# Patient Record
Sex: Female | Born: 1975 | Race: White | Hispanic: No | Marital: Single | Smoking: Never smoker
Health system: Southern US, Community
[De-identification: ages and names within clinical notes are randomized; demographics above are authoritative.]

## PROBLEM LIST (undated history)

## (undated) DIAGNOSIS — R7303 Prediabetes: Secondary | ICD-10-CM

## (undated) DIAGNOSIS — U071 COVID-19: Secondary | ICD-10-CM

## (undated) HISTORY — DX: Prediabetes: R73.03

## (undated) HISTORY — PX: TUBAL LIGATION: SHX77

---

## 2005-02-15 ENCOUNTER — Other Ambulatory Visit: Admission: RE | Admit: 2005-02-15 | Discharge: 2005-02-15 | Payer: Self-pay | Admitting: Obstetrics and Gynecology

## 2005-02-16 ENCOUNTER — Ambulatory Visit (HOSPITAL_COMMUNITY): Admission: RE | Admit: 2005-02-16 | Discharge: 2005-02-16 | Payer: Self-pay | Admitting: Obstetrics and Gynecology

## 2005-04-01 ENCOUNTER — Inpatient Hospital Stay (HOSPITAL_COMMUNITY): Admission: AD | Admit: 2005-04-01 | Discharge: 2005-04-01 | Payer: Self-pay | Admitting: *Deleted

## 2005-06-08 ENCOUNTER — Inpatient Hospital Stay (HOSPITAL_COMMUNITY): Admission: AD | Admit: 2005-06-08 | Discharge: 2005-06-08 | Payer: Self-pay | Admitting: Obstetrics and Gynecology

## 2005-06-17 ENCOUNTER — Encounter (INDEPENDENT_AMBULATORY_CARE_PROVIDER_SITE_OTHER): Payer: Self-pay | Admitting: Specialist

## 2005-06-17 ENCOUNTER — Inpatient Hospital Stay (HOSPITAL_COMMUNITY): Admission: RE | Admit: 2005-06-17 | Discharge: 2005-06-20 | Payer: Self-pay | Admitting: Obstetrics and Gynecology

## 2007-08-11 ENCOUNTER — Emergency Department (HOSPITAL_COMMUNITY): Admission: EM | Admit: 2007-08-11 | Discharge: 2007-08-11 | Payer: Self-pay | Admitting: Family Medicine

## 2007-09-07 ENCOUNTER — Ambulatory Visit (HOSPITAL_COMMUNITY): Admission: RE | Admit: 2007-09-07 | Discharge: 2007-09-07 | Payer: Self-pay | Admitting: Obstetrics & Gynecology

## 2007-12-27 ENCOUNTER — Ambulatory Visit: Payer: Self-pay | Admitting: Obstetrics & Gynecology

## 2007-12-27 ENCOUNTER — Inpatient Hospital Stay (HOSPITAL_COMMUNITY): Admission: AD | Admit: 2007-12-27 | Discharge: 2007-12-30 | Payer: Self-pay | Admitting: Obstetrics & Gynecology

## 2008-01-09 ENCOUNTER — Inpatient Hospital Stay (HOSPITAL_COMMUNITY): Admission: AD | Admit: 2008-01-09 | Discharge: 2008-01-09 | Payer: Self-pay | Admitting: Obstetrics and Gynecology

## 2008-11-12 ENCOUNTER — Emergency Department (HOSPITAL_COMMUNITY): Admission: EM | Admit: 2008-11-12 | Discharge: 2008-11-12 | Payer: Self-pay | Admitting: Family Medicine

## 2008-12-03 ENCOUNTER — Emergency Department (HOSPITAL_COMMUNITY): Admission: EM | Admit: 2008-12-03 | Discharge: 2008-12-03 | Payer: Self-pay | Admitting: Family Medicine

## 2010-07-22 ENCOUNTER — Other Ambulatory Visit: Admission: RE | Admit: 2010-07-22 | Discharge: 2010-07-22 | Payer: Self-pay | Admitting: Family Medicine

## 2010-11-07 ENCOUNTER — Encounter: Payer: Self-pay | Admitting: Obstetrics and Gynecology

## 2010-11-30 ENCOUNTER — Other Ambulatory Visit: Payer: Self-pay | Admitting: Obstetrics and Gynecology

## 2011-03-01 NOTE — Op Note (Signed)
NAMEDEMETRESS, [name]             ACCOUNT NO.:  [id number]   MEDICAL RECORD NO.:  [id number]          PATIENT TYPE:  MAT   LOCATION:  MATC                          [facility]   PHYSICIAN:  Phil D. Okey Dupre, M.D.     DATE OF BIRTH:  1976-05-07   DATE OF PROCEDURE:  12/29/2007  DATE OF DISCHARGE:                               OPERATIVE REPORT   PREOPERATIVE DIAGNOSES:  1. Postpartum day number 1 from spontaneous vaginal delivery.  2. Desires sterilization.   POSTOPERATIVE DIAGNOSES:  1. Postpartum day number 1 from spontaneous vaginal delivery.  2. Desires sterilization.   PROCEDURE:  Bilateral tubal occlusion with Filshie clips.   SURGEON:  Argentina Donovan, MD.   ASSISTANT:  Karlton Lemon, MD.   ANESTHESIA:  Epidural.   FINDINGS:  Normal female anatomy.   ESTIMATED BLOOD LOSS:  Minimal.   DRAINS:  Foley with clear yellow urine.   COMPLICATIONS:  None immediate.   SPECIMENS:  None.   INDICATION FOR PROCEDURE:  [name] is a 35 year old gravida 5, para  4-0-1-4, who is postpartum day #1 from spontaneous vaginal delivery.  She desires bilateral tubal ligation, has signed her consent form 30  days in advance.  She has been counseled as to the permanent nature of  the tubal and wishes to proceed.   DESCRIPTION OF THE PROCEDURE:  The patient was taken to the operating  room and after obtaining adequate epidural anesthesia was prepped and  draped in the usual sterile manner in the supine position.  After  ensuring adequate anesthesia, a transverse skin incision was made 1 cm  below the umbilicus using a scalpel.  The incision was carried down to  the subcutaneous tissues using the scalpel.  The subcutaneous tissue and  fascia were dissected further with the Mayo scissors.  The peritoneum  and fascia were entered with Mayo scissors and retractors were placed.  Retractors were used to help visualize the tubes.  Bowel and omentum  obscured the view and a wet laparotomy sponge  was placed within the  peritoneal cavity with a tag to help with visualization.  The patient  was placed in Trendelenburg with a leftward tilt.  The right tube was  identified and clamped with a Babcock clamp.  A second Babcock clamp was  placed and the tube was followed out to the fimbria.  The fimbria was  identified easily.  The Filshie clip was then placed between the Babcock  clamps of the isthmus ampullary junction.  The clip was placed under  direct visualization with good occlusion of the tube.  The Babcock  clamps were then released and the tube was allowed to fall back within  the peritoneal cavity.  Attention was then turned to the left tube.  The  patient was left in Trendelenburg and turned to the right.  The left  tube was well visualized and clamped with a Babcock clamp.  Once again  the tube was followed out to the fimbria using a second Babcock clamp.  A Filshie clip was then placed between the two Babcock clamps at the  isthmus ampullary  junction of the tube, under direct visualization, with  good occlusion of the tube.  After the Filshie clip was placed the tube  was released from the Babcock clamps and the patient placed back within  the peritoneal cavity.  The fascia was then reapproximated with #0  Vicryl in a running and a locked fashion.  A subcuticular stitch was  used to close the skin with #3-0 Vicryl.  Dermabond was used on the skin  as well.  Sponge, needle and instrument counts were correct x2.  The  patient tolerated the procedure well and to the Post Anesthesia Care  Unit in stable condition.      Karlton Lemon, MD  Electronically Signed     ______________________________  Javier Glazier. Okey Dupre, M.D.    NS/MEDQ  D:  12/30/2007  T:  12/30/2007  Job:  [id number]

## 2011-03-04 NOTE — Op Note (Signed)
[name], [name]             ACCOUNT NO.:  [id number]   MEDICAL RECORD NO.:  [id number]          PATIENT TYPE:  MAT   LOCATION:  MATC                          [facility]   PHYSICIAN:  Hal Morales, M.D.DATE OF BIRTH:  12-Oct-1976   DATE OF PROCEDURE:  06/08/2005  DATE OF DISCHARGE:                                 OPERATIVE REPORT   PREOPERATIVE DIAGNOSES:  1.  Intrauterine pregnancy at 36-5/7 weeks.  2.  Homero Fellers breech presentation.  3.  Multipara.  4.  Lower limits of normal amniotic fluid index of 8.  5.  Rh negative status post RhoGAM and 28 weeks.   POSTOPERATIVE DIAGNOSES:  1.  Intrauterine pregnancy at 36-5/7 weeks.  2.  Homero Fellers breech presentation.  3.  Multipara.  4.  Lower limits of normal amniotic fluid index of 8.  5.  Rh negative status post RhoGAM and 28 weeks.   OPERATION:  Attempted external cephalic version.   SURGEON:  Hal Morales, M.D.   FIRST ASSISTANT:  Rhona Leavens, CNM   ANESTHESIA:  None.   ESTIMATED BLOOD LOSS:  None.   COMPLICATIONS:  Inability to execute version.   FINDINGS:  The infant at the time of the preprocedure ultrasound had the  head in the right upper quadrant and the breech in the pelvis. By the time  she had been prepped for version the head was in the left upper quadrant  with the breech in the pelvis. Pelvic examination was 1 cm, cervix was long  and the breech was ballotable. The NST prior to procedure was reactive.   PROCEDURE:  The patient was in the supine position and after noting a  reactive nonstress test and giving terbutaline 0.25 milligrams  subcutaneously, she underwent a second ultrasound for head placement with  the finding of the head in the left upper quadrant. The abdomen was  moistened with ultrasound gel and an attempt made to this allow the baby to  executed a backward roll with the head in the left upper quadrant being  guided toward the pelvis. Several attempts were made to execute this  movement without any significant movement of the fetal head. The external  monitor was then placed on the mother's abdomen and the fetal heart rate in  the 160 range was noted. After a rest period of approximately 3 minutes for  listening to the fetal heart rate, an attempt was made to execute a forward  roll bringing the head from the left upper quadrant to the right upper  quadrant and then toward the pelvis. A similar procedure was used and the  head was moved from the left upper quadrant to the right upper quadrant but  no movement toward the pelvis was possible in spite of several attempts. At  that time the procedure was abandoned and a nonstress test undertaken which  was noted to be reactive. Blood was drawn to determine whether the patient  remained with a positive antibody screen as a result of her RhoGAM at [redacted]  weeks gestation. That result was pending at the time of dictation. However,  if  the antibody screen is positive it will be deemed that she has adequate  protection from any slight number of fetal red cells which may result from  the version, however if the antibody screen negative, she will be given  another dose of RhoGAM.   A discussion was held with the patient concerning further management and a  suggestion made that she plan to undergo cesarean section at [redacted] weeks  gestation if the baby has not moved  to the vertex position. She will follow-  up in the office at [facility]  for women in 1 week.      Hal Morales, M.D.  Electronically Signed     VPH/MEDQ  D:  06/08/2005  T:  06/08/2005  Job:  [id number]

## 2011-03-04 NOTE — Discharge Summary (Signed)
[name], [name]             ACCOUNT NO.:  [id number]   MEDICAL RECORD NO.:  [id number]          PATIENT TYPE:  INP   LOCATION:  9134                          [facility]   PHYSICIAN:  Crist Fat. Rivard, M.D. DATE OF BIRTH:  08-17-1976   DATE OF ADMISSION:  06/17/2005  DATE OF DISCHARGE:  06/20/2005                                 DISCHARGE SUMMARY   ADMISSION DIAGNOSES:  1.  Intrauterine pregnancy at 39 weeks.  2.  Breech presentation.  3.  Failure of external version.   DISCHARGE DIAGNOSES:  1.  Intrauterine pregnancy at 39 weeks.  2.  Breech presentation.  3.  Failure of external version.  4.  Primary low transverse cesarean section of a viable female infant named      Abigail, Apgars 9 and 9, weighing 7 pounds 8 ounces.  5.  Postoperative anemia.   PROCEDURE:  1.  Spinal anesthesia.  2.  Primary low transverse cesarean section.   HOSPITAL COURSE:  The patient was admitted for an elective cesarean section  for breech presentation which failed version.  She was delivered of a female  infant named Abigail, Apgars 9 and 9, weighing 7 pounds 8 ounces.  Estimated  blood loss was 600 cc.  There were no complications.  The procedure was  performed under spinal anesthesia, and the patient was taken to recovery and  then to the Mother/Baby unit where she did well.   On postoperative day #1, hemoglobin was 8.4.  She was started on iron.  JP  was draining a small amount of serosanguineous fluid.  The incision was  clean, dry, and intact and pain was well-controlled with Motrin.  She was  bottle feeding her infant.   On postoperative day #2, the patient continued to improve, and routine care  was continued.   On postoperative day #3, she was ready to go home.   PHYSICAL EXAMINATION:  VITAL SIGNS:  Vital signs were stable.  She was  afebrile.  CHEST:  Clear to auscultation.  HEART:  Regular rate and rhythm.  ABDOMEN:  Soft and appropriately tender.  JP was draining only  2 to 3 cc of  serosanguineous fluid and was removed atraumatically.  The incision with  Steri-Strips was clean, dry, and intact.  Lochia was small.  EXTREMITIES:  Within normal limits.   DISPOSITION:  The patient was deemed to have received the full benefit of  her hospital stay and was discharged home.   DISCHARGE MEDICATIONS:  1.  Motrin 600 mg p.o. q.6h. p.r.n.  2.  Tylox one to two p.o. q.4h. p.r.n.  3.  Iron one p.o. daily.   DISCHARGE LABORATORY DATA:  White blood cell count 9.8, hemoglobin 8.4,  platelets 223.   DISCHARGE INSTRUCTIONS:  The patient received handout.   FOLLOW UP:  Discharge followup in six weeks or p.r.n.      Marie L. Williams, C.N.M.      Crist Fat Rivard, M.D.  Electronically Signed    MLW/MEDQ  D:  06/20/2005  T:  06/20/2005  Job:  [id number]

## 2011-03-04 NOTE — Op Note (Signed)
[name], [name]             ACCOUNT NO.:  [id number]   MEDICAL RECORD NO.:  [id number]          PATIENT TYPE:  INP   LOCATION:  9199                          [facility]   PHYSICIAN:  Crist Fat. Rivard, M.D. DATE OF BIRTH:  [birth year]   DATE OF PROCEDURE:  06/17/2005  DATE OF DISCHARGE:                                 OPERATIVE REPORT   PREOPERATIVE DIAGNOSES:  1.  Intrauterine pregnancy at 39 weeks.  2.  Breech presentation.  3.  Failure of external cephalic version.   POSTOPERATIVE DIAGNOSES:  1.  Intrauterine pregnancy at 39 weeks.  2.  Breech presentation.  3.  Failure of external cephalic version.   ANESTHESIA:  Spinal, Burnett Corrente, M.D.   PROCEDURE:  Primary low transverse cesarean section.   SURGEON:  Crist Fat. Rivard, M.D.   ASSISTANTRenaldo Reel. Latham, C.N.M.   ESTIMATED BLOOD LOSS:  600 mL.   PROCEDURE:  After being informed of the planned procedure with possible  complications including bleeding, infection, injury to bladder, bowels or  ureter, informed consent was obtained.  The patient is taken to cesarean  suite, given spinal anesthesia without complication, placed in a dorsal  decubitus position, pelvis tilted to the left.  The patient is then prepped  and draped in a sterile fashion.  A Foley catheter is inserted in her  bladder.   After assessing adequate level of anesthesia, we infiltrate the suprapubic  area with 20 mL of Marcaine 0.25% and perform a Pfannenstiel incision, which  was brought down to the fascia.  Fascia is incised in a low transverse  fashion.  Linea alba is dissected.  Peritoneum is entered in the midline  fashion.  Visceral peritoneum is entered in a low transverse fashion,  allowing [place] to safely retract bladder by developing a bladder flap.  Myometrium is then entered in a low transverse fashion, first with knife,  then extended bluntly.  Amniotic fluid is clear.  We assist the birth of a  female infant in complete  breech at 11:26.  Mouth and nose are suctioned.  Cord is clamped with two Kelly clamps and sectioned and the baby is given to  the pediatrician present in the room.  Ancef2 g IV is given to the patient,  20 mL of blood is drawn from the umbilical vein, and the placenta is allowed  to deliver spontaneously.  It is complete, and the cord has three vessels.  Uterine revision is negative.   The uterus is then closed in two layers first with a running locked suture  of 0 Vicryl, then with a Lembert suture of 0 Vicryl imbricating the first  layer.  Hemostasis is assessed and completed on the peritoneal edges with  cautery.  Both paracolic gutters are cleaned, both tubes and ovaries  assessed and normal.  The pelvis is then profusely irrigated with warm  saline.  Hemostasis is reassessed and deemed adequate.   Under fascia hemostasis is completed with cautery and the fascia is closed  with two running sutures of 1 Vicryl meeting midline.  Wound is then  irrigated with  warm saline.  Hemostasis is completed with cautery and a JP  #10 drain is placed with a left counter incision and sutured with a 0 silk.  Skin is then closed with a subcuticular suture of 4-0 Monocryl and Steri-  Strips.   Instrument and sponge count is complete x2.  Estimated blood loss was 600  mL.  The procedure is well-tolerated by the patient, is taken to recovery  room in a well and stable condition.   A little girl named Cammy Copa was born at 11:26, received an Apgar of 9 at one  minute and 9 at five minutes and weighs 7 pounds 8 ounces.      Crist Fat Rivard, M.D.  Electronically Signed     SAR/MEDQ  D:  06/17/2005  T:  06/17/2005  Job:  [id number]
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
## 2011-05-25 ENCOUNTER — Other Ambulatory Visit (HOSPITAL_COMMUNITY)
Admission: RE | Admit: 2011-05-25 | Discharge: 2011-05-25 | Disposition: A | Discharge status: Home / Self Care | Payer: BC Managed Care – PPO | Source: Ambulatory Visit | Attending: Family Medicine | Admitting: Family Medicine

## 2011-05-25 ENCOUNTER — Other Ambulatory Visit: Payer: Self-pay | Admitting: Family Medicine

## 2011-05-25 DIAGNOSIS — Z124 Encounter for screening for malignant neoplasm of cervix: Secondary | ICD-10-CM | POA: Insufficient documentation

## 2011-05-25 DIAGNOSIS — Z1159 Encounter for screening for other viral diseases: Secondary | ICD-10-CM | POA: Insufficient documentation

## 2011-07-11 LAB — RPR: RPR Ser Ql: NONREACTIVE

## 2011-07-11 LAB — RH IMMUNE GLOB WKUP(>/=20WKS)(NOT WOMEN'S HOSP): Fetal Screen: NEGATIVE

## 2011-07-11 LAB — CBC
Hemoglobin: 11.6 — ABNORMAL LOW
MCV: 73.5 — ABNORMAL LOW
Platelets: 215
RBC: 4.73
RDW: 18.5 — ABNORMAL HIGH
WBC: 12.8 — ABNORMAL HIGH
WBC: 18.3 — ABNORMAL HIGH

## 2012-08-18 ENCOUNTER — Encounter (HOSPITAL_COMMUNITY): Payer: Self-pay | Admitting: *Deleted

## 2012-08-18 ENCOUNTER — Emergency Department (HOSPITAL_COMMUNITY)
Admission: EM | Admit: 2012-08-18 | Discharge: 2012-08-18 | Disposition: A | Discharge status: Home / Self Care | Payer: Self-pay | Source: Home / Self Care | Attending: Family Medicine | Admitting: Family Medicine

## 2012-08-18 DIAGNOSIS — J069 Acute upper respiratory infection, unspecified: Secondary | ICD-10-CM

## 2012-08-18 MED ORDER — IPRATROPIUM BROMIDE 0.06 % NA SOLN
2.0000 | Freq: Four times a day (QID) | NASAL | Status: DC
Start: 2012-08-18 — End: 2017-02-26

## 2012-08-18 MED ORDER — HYDROCOD POLST-CHLORPHEN POLST 10-8 MG/5ML PO LQCR: 5.0000 mL | Freq: Two times a day (BID) | ORAL | Status: DC

## 2012-08-18 NOTE — ED Provider Notes (Signed)
History     CSN: [id number]  Arrival date & time 08/18/12  1023   First MD Initiated Contact with Patient 08/18/12 1039      Chief Complaint  Patient presents with  . Facial Pain    (Consider location/radiation/quality/duration/timing/severity/associated sxs/prior treatment) Patient is a 36 y.o. female presenting with URI. The history is provided by the patient.  URI The primary symptoms include cough. Primary symptoms do not include fever or sore throat. The current episode started more than 1 week ago. This is a new problem. The problem has not changed since onset. Symptoms associated with the illness include facial pain, sinus pressure, congestion and rhinorrhea.    History reviewed. No pertinent past medical history.  History reviewed. No pertinent past surgical history.  No family history on file.  History  Substance Use Topics  . Smoking status: Current Every Day Smoker  . Smokeless tobacco: Not on file  . Alcohol Use: No    OB History    Grav Para Term Preterm Abortions TAB SAB Ect Mult Living                  Review of Systems  Constitutional: Negative.  Negative for fever.  HENT: Positive for congestion, rhinorrhea and sinus pressure. Negative for sore throat.   Respiratory: Positive for cough.     Allergies  Review of patient's allergies indicates no known allergies.  Home Medications   Current Outpatient Rx  Name Route Sig Dispense Refill  . MUCINEX PO Oral Take by mouth.    Marland Kitchen HYDROCOD POLST-CPM POLST ER 10-8 MG/5ML PO LQCR Oral Take 5 mLs by mouth every 12 (twelve) hours. 115 mL 0  . IPRATROPIUM BROMIDE 0.06 % NA SOLN Nasal Place 2 sprays into the nose 4 (four) times daily. 15 mL 1    BP 124/72  Pulse 82  Temp 98.2 F (36.8 C) (Oral)  Resp 16  SpO2 100%  LMP 08/18/2012  Physical Exam  Nursing note and vitals reviewed. Constitutional: She is oriented to person, place, and time. She appears well-developed and well-nourished.  HENT:    Head: Normocephalic.  Right Ear: External ear normal.  Left Ear: External ear normal.  Nose: Mucosal edema and rhinorrhea present.  Mouth/Throat: Oropharynx is clear and moist.  Eyes: Conjunctivae normal are normal. Pupils are equal, round, and reactive to light.  Neck: Normal range of motion. Neck supple.  Neurological: She is alert and oriented to person, place, and time.  Skin: Skin is warm and dry.    ED Course  Procedures (including critical care time)  Labs Reviewed - No data to display No results found.   1. URI (upper respiratory infection)       MDM          Linna Hoff, MD 08/18/12 1151

## 2012-08-18 NOTE — ED Notes (Signed)
Pt  Reports  Symptoms  Of   Sinus  Congestion  Stuffy  Nose    Earaches    Have  Pressure  sentsation        Symptoms  X  1    Week   unreleived  By otc  meds         In no  Acute  Distress

## 2017-02-26 ENCOUNTER — Encounter (HOSPITAL_COMMUNITY): Payer: Self-pay | Admitting: Emergency Medicine

## 2017-02-26 ENCOUNTER — Ambulatory Visit (HOSPITAL_COMMUNITY)
Admission: EM | Admit: 2017-02-26 | Discharge: 2017-02-26 | Disposition: A | Discharge status: Home / Self Care | Payer: BC Managed Care – PPO | Attending: Internal Medicine | Admitting: Internal Medicine

## 2017-02-26 DIAGNOSIS — J019 Acute sinusitis, unspecified: Secondary | ICD-10-CM

## 2017-02-26 MED ORDER — BENZONATATE 200 MG PO CAPS: 200.0000 mg | ORAL_CAPSULE | Freq: Three times a day (TID) | ORAL | 1 refills | Status: DC | PRN

## 2017-02-26 MED ORDER — AMOXICILLIN-POT CLAVULANATE 875-125 MG PO TABS: 1.0000 | ORAL_TABLET | Freq: Two times a day (BID) | ORAL | 0 refills | Status: DC

## 2017-02-26 NOTE — ED Provider Notes (Signed)
[facility]    CSN: [id number] Arrival date & time: 02/26/17  1204     History   Chief Complaint Chief Complaint  Patient presents with  . Cough    HPI Smiley HousemanMelissa L Bilotta is a 41 y.o. female.  She presents today with about a 10 day history of nasal congestion, postnasal drainage, runny nose. Lots of coughing, occasional posttussive emesis. No fever, not much headache. Had some sore throat initially. Not really achy. Little bit of nausea, little bit of diarrhea.     HPI  History reviewed. No pertinent past medical history.  History reviewed. No pertinent surgical history.   Home Medications    Prior to Admission medications   Medication Sig Start Date End Date Taking? Authorizing Provider  GuaiFENesin (MUCINEX PO) Take by mouth.   Yes [provider]  amoxicillin-clavulanate (AUGMENTIN) 875-125 MG tablet Take 1 tablet by mouth every 12 (twelve) hours. 02/26/17   Eustace MooreMurray, Lateia Fraser W, MD  benzonatate (TESSALON) 200 MG capsule Take 1 capsule (200 mg total) by mouth 3 (three) times daily as needed for cough. 02/26/17   Eustace MooreMurray, Orel Hord W, MD    Family History History reviewed. No pertinent family history.  Social History Social History  Substance Use Topics  . Smoking status: Current Every Day Smoker  . Smokeless tobacco: Not on file  . Alcohol use No     Allergies   Patient has no known allergies.   Review of Systems Review of Systems  All other systems reviewed and are negative.    Physical Exam Triage Vital Signs ED Triage Vitals  Enc Vitals Group     BP 02/26/17 1237 (!) 143/88     Pulse Rate 02/26/17 1237 79     Resp 02/26/17 1237 18     Temp 02/26/17 1237 98.3 F (36.8 C)     Temp Source 02/26/17 1237 Oral     SpO2 02/26/17 1237 99 %     Weight --      Height --      Pain Score 02/26/17 1238 0     Pain Loc --    Updated Vital Signs BP (!) 143/88 (BP Location: Right Arm)   Pulse 79   Temp 98.3 F (36.8 C) (Oral)   Resp 18    SpO2 99%   Physical Exam  Constitutional: She is oriented to person, place, and time. No distress.  HENT:  Head: Atraumatic.  Bilateral TMs dull, right is pink flushed Marked nasal congestion bilaterally Throat is somewhat red, with postnasal drainage  Eyes:  Conjugate gaze observed, no eye redness/discharge  Neck: Neck supple.  Cardiovascular: Normal rate and regular rhythm.   Pulmonary/Chest: No respiratory distress. She has no wheezes. She has no rales.  Slightly coarse breath sounds, symmetric throughout  Abdominal: She exhibits no distension.  Musculoskeletal: Normal range of motion.  Neurological: She is alert and oriented to person, place, and time.  Skin: Skin is warm and dry.  Nursing note and vitals reviewed.    UC Treatments / Results   Procedures Procedures (including critical care time) None today  Final Clinical Impressions(s) / UC Diagnoses   Final diagnoses:  Acute sinusitis with symptoms > 10 days   Anticipate gradual improvement in runny/congested nose and cough over the next several days.  Cough may take a couple weeks to subside.   Prescriptions for amoxicillin/clavulanate (antibiotic) and benzonatate (for cough) were sent to the [facility] on [address].  Recheck for new fever >  100.5, increasing phlegm production/nasal discharge, or if not starting to improve in a few days.   New Prescriptions New Prescriptions   AMOXICILLIN-CLAVULANATE (AUGMENTIN) 875-125 MG TABLET    Take 1 tablet by mouth every 12 (twelve) hours.   BENZONATATE (TESSALON) 200 MG CAPSULE    Take 1 capsule (200 mg total) by mouth 3 (three) times daily as needed for cough.     Eustace Moore, MD 03/01/17 2159

## 2017-02-26 NOTE — Discharge Instructions (Addendum)
Anticipate gradual improvement in runny/congested nose and cough over the next several days.  Cough may take a couple weeks to subside.   Prescriptions for amoxicillin/clavulanate (antibiotic) and benzonatate (for cough) were sent to the [facility] on [address].  Recheck for new fever >100.5, increasing phlegm production/nasal discharge, or if not starting to improve in a few days.

## 2017-02-26 NOTE — ED Triage Notes (Signed)
[facility] a [facility] of a cough and congestion x 1 week. The patient reported using OTC medications with minimal relief.

## 2018-12-23 ENCOUNTER — Encounter (HOSPITAL_COMMUNITY): Payer: Self-pay | Admitting: Emergency Medicine

## 2018-12-23 ENCOUNTER — Ambulatory Visit (HOSPITAL_COMMUNITY)
Admission: EM | Admit: 2018-12-23 | Discharge: 2018-12-23 | Disposition: A | Discharge status: Home / Self Care | Payer: BC Managed Care – PPO | Attending: Family Medicine | Admitting: Family Medicine

## 2018-12-23 DIAGNOSIS — J019 Acute sinusitis, unspecified: Secondary | ICD-10-CM | POA: Diagnosis not present

## 2018-12-23 LAB — POCT RAPID STREP A: STREPTOCOCCUS, GROUP A SCREEN (DIRECT): POSITIVE — AB

## 2018-12-23 MED ORDER — AMOXICILLIN-POT CLAVULANATE 875-125 MG PO TABS: 1.0000 | ORAL_TABLET | Freq: Two times a day (BID) | ORAL | 0 refills | Status: AC

## 2018-12-23 MED ORDER — BENZONATATE 200 MG PO CAPS: 200.0000 mg | ORAL_CAPSULE | Freq: Three times a day (TID) | ORAL | 0 refills | Status: AC | PRN

## 2018-12-23 NOTE — ED Provider Notes (Addendum)
[facility]    CSN: [id number] Arrival date & time: 12/23/18  1021     History   Chief Complaint Chief Complaint  Patient presents with  . URI    HPI [name] is a 43 y.o. female no significant past medical history, Patient is presenting with URI symptoms- congestion, cough, sore throat. Patient's main complaints are continued cough. Symptoms have been going on for 2 weeks. Patient has tried Aleve cold and flu, occasional Claritin, Robitussin, with minimal relief. Denies fever, nausea, vomiting, diarrhea. Denies shortness of breath and chest pain.    HPI  History reviewed. No pertinent past medical history.  There are no active problems to display for this patient.   History reviewed. No pertinent surgical history.  OB History   No obstetric history on file.      Home Medications    Prior to Admission medications   Medication Sig Start Date End Date Taking? Authorizing Provider  amoxicillin-clavulanate (AUGMENTIN) 875-125 MG tablet Take 1 tablet by mouth every 12 (twelve) hours for 10 days. 12/23/18 01/02/19  ,  C, PA-C  benzonatate (TESSALON) 200 MG capsule Take 1 capsule (200 mg total) by mouth 3 (three) times daily as needed for up to 7 days for cough. 12/23/18 12/30/18  ,  C, PA-C  GuaiFENesin (MUCINEX PO) Take by mouth.    [provider]    Family History Family History  Problem Relation Age of Onset  . Healthy Mother     Social History Social History   Tobacco Use  . Smoking status: Current Every Day Smoker  . Smokeless tobacco: Never Used  Substance Use Topics  . Alcohol use: No  . Drug use: Never     Allergies   Patient has no known allergies.   Review of Systems Review of Systems  Constitutional: Positive for chills and fatigue. Negative for activity change, appetite change and fever.  HENT: Positive for congestion, ear pain, rhinorrhea, sinus pressure and sore throat. Negative for trouble  swallowing.   Eyes: Negative for discharge and redness.  Respiratory: Positive for cough. Negative for chest tightness and shortness of breath.   Cardiovascular: Negative for chest pain.  Gastrointestinal: Negative for abdominal pain, diarrhea, nausea and vomiting.  Musculoskeletal: Negative for myalgias.  Skin: Negative for rash.  Neurological: Negative for dizziness, light-headedness and headaches.     Physical Exam Triage Vital Signs ED Triage Vitals [12/23/18 1033]  Enc Vitals Group     BP (!) 151/90     Pulse Rate 85     Resp 18     Temp 98.1 F (36.7 C)     Temp Source Oral     SpO2 100 %     Weight      Height      Head Circumference      Peak Flow      Pain Score 3     Pain Loc      Pain Edu?      Excl. in [place]?    No data found.  Updated Vital Signs BP (!) 151/90 (BP Location: Right Arm)   Pulse 85   Temp 98.1 F (36.7 C) (Oral)   Resp 18   SpO2 100%   Visual Acuity Right Eye Distance:   Left Eye Distance:   Bilateral Distance:    Right Eye Near:   Left Eye Near:    Bilateral Near:     Physical Exam Vitals signs and nursing note reviewed.  Constitutional:      General: She is not in acute distress.    Appearance: She is well-developed.  HENT:     Head: Normocephalic and atraumatic.     Ears:     Comments: Bilateral ears without tenderness to palpation of external auricle, tragus and mastoid, EAC's without erythema or swelling, TM's with good bony landmarks and cone of light. Non erythematous.     Nose:     Comments: Nasal mucosa erythematous, rhinorrhea present    Mouth/Throat:     Comments: Oral mucosa pink and moist, no tonsillar enlargement or exudate, tonsils do appear erythematous. Posterior pharynx patent and erythematous, no uvula deviation or swelling. Normal phonation. Eyes:     Conjunctiva/sclera: Conjunctivae normal.  Neck:     Musculoskeletal: Neck supple.  Cardiovascular:     Rate and Rhythm: Normal rate and regular rhythm.      Heart sounds: No murmur.  Pulmonary:     Effort: Pulmonary effort is normal. No respiratory distress.     Breath sounds: Normal breath sounds.     Comments: Breathing comfortably at rest, CTABL, no wheezing, rales or other adventitious sounds auscultated  Abdominal:     Palpations: Abdomen is soft.     Tenderness: There is no abdominal tenderness.  Skin:    General: Skin is warm and dry.  Neurological:     Mental Status: She is alert.      UC Treatments / Results  Labs (all labs ordered are listed, but only abnormal results are displayed) Labs Reviewed  POCT RAPID STREP A    EKG None  Radiology No results found.  Procedures Procedures (including critical care time)  Medications Ordered in UC Medications - No data to display  Initial Impression / Assessment and Plan / UC Course  I have reviewed the triage vital signs and the nursing notes.  Pertinent labs & imaging results that were available during my care of the patient were reviewed by me and considered in my medical decision making (see chart for details).     URI symptoms x2 weeks, worsening sore throat, strep test positive, given length of symptoms will treat for sinusitis/strep, will treat with Augmentin, continue symptomatic management of congestion and cough, Tessalon as needed, continue Claritin, may add in Mucinex or Sudafed.  Rest, drink fluids.Discussed strict return precautions. Patient verbalized understanding and is agreeable with plan.  Final Clinical Impressions(s) / UC Diagnoses   Final diagnoses:  Acute sinusitis with symptoms > 10 days     Discharge Instructions     Please begin taking Augmentin twice daily for the next 10 days May continue Claritin, may supplement with Flonase, and Mucinex for congestion and drainage May use Tessalon for cough or over-the-counter Delsym, Robitussin, Mucinex DM  For sore throat try using a honey-based tea. Use 3 teaspoons of honey with juice squeezed from  half lemon. Place shaved pieces of ginger into 1/2-1 cup of water and warm over stove top. Then mix the ingredients and repeat every 4 hours as needed.  Please follow-up if symptoms not resolving, worsening, developing difficulty breathing, shortness of breath or fevers    ED Prescriptions    Medication Sig Dispense Auth. Provider   amoxicillin-clavulanate (AUGMENTIN) 875-125 MG tablet Take 1 tablet by mouth every 12 (twelve) hours for 10 days. 20 tablet ,  C, PA-C   benzonatate (TESSALON) 200 MG capsule Take 1 capsule (200 mg total) by mouth 3 (three) times daily as needed for up to 7 days  for cough. 28 capsule ,  C, PA-C     Controlled Substance Prescriptions [place] Controlled Substance Registry consulted? No   Lew Dawes, PA-C 12/23/18 1109    , [place] C, [place] 12/23/18 1109

## 2018-12-23 NOTE — ED Triage Notes (Signed)
Pt sts URI sx x 2 weeks  

## 2018-12-23 NOTE — Discharge Instructions (Signed)
Please begin taking Augmentin twice daily for the next 10 days May continue Claritin, may supplement with Flonase, and Mucinex for congestion and drainage May use Tessalon for cough or over-the-counter Delsym, Robitussin, Mucinex DM  For sore throat try using a honey-based tea. Use 3 teaspoons of honey with juice squeezed from half lemon. Place shaved pieces of ginger into 1/2-1 cup of water and warm over stove top. Then mix the ingredients and repeat every 4 hours as needed.  Please follow-up if symptoms not resolving, worsening, developing difficulty breathing, shortness of breath or fevers

## 2020-03-27 ENCOUNTER — Encounter (HOSPITAL_COMMUNITY): Payer: Self-pay

## 2020-03-27 ENCOUNTER — Other Ambulatory Visit: Payer: Self-pay

## 2020-03-27 ENCOUNTER — Ambulatory Visit (HOSPITAL_COMMUNITY)
Admission: EM | Admit: 2020-03-27 | Discharge: 2020-03-27 | Disposition: A | Discharge status: Home / Self Care | Payer: BC Managed Care – PPO | Attending: Emergency Medicine | Admitting: Emergency Medicine

## 2020-03-27 DIAGNOSIS — N390 Urinary tract infection, site not specified: Secondary | ICD-10-CM | POA: Insufficient documentation

## 2020-03-27 DIAGNOSIS — Z3202 Encounter for pregnancy test, result negative: Secondary | ICD-10-CM | POA: Diagnosis not present

## 2020-03-27 HISTORY — DX: COVID-19: U07.1

## 2020-03-27 LAB — POCT URINALYSIS DIP (DEVICE)
Bilirubin Urine: NEGATIVE
Glucose, UA: NEGATIVE mg/dL
Ketones, ur: NEGATIVE mg/dL
Nitrite: POSITIVE — AB
Protein, ur: NEGATIVE mg/dL
Specific Gravity, Urine: 1.025 (ref 1.005–1.030)
Urobilinogen, UA: 0.2 mg/dL (ref 0.0–1.0)
pH: 8.5 — ABNORMAL HIGH (ref 5.0–8.0)

## 2020-03-27 LAB — POC URINE PREG, ED: Preg Test, Ur: NEGATIVE

## 2020-03-27 MED ORDER — CEPHALEXIN 500 MG PO CAPS: 500.0000 mg | ORAL_CAPSULE | Freq: Two times a day (BID) | ORAL | 0 refills | Status: AC

## 2020-03-27 NOTE — Discharge Instructions (Signed)
Urine showed evidence of infection. We are treating you with keflex- twice daily for 5 days. Be sure to take full course. Stay hydrated- urine should be pale yellow to clear.   Please return or follow up with your primary provider if symptoms not improving with treatment. Please return sooner if you have worsening of symptoms or develop fever, nausea, vomiting, abdominal pain, back pain, lightheadedness, dizziness.  

## 2020-03-27 NOTE — ED Triage Notes (Signed)
Pt c/o frequent urination, dysuriax3 days.

## 2020-03-28 NOTE — ED Provider Notes (Signed)
[place]    CSN: [id number] Arrival date & time: 03/27/20  1857      History   Chief Complaint Chief Complaint  Patient presents with  . Urinary Tract Infection    HPI [name] is a 44 y.o. female no significant past medical story presented today for evaluation of possible UTI.  Patient reports over the past 3 days she has had dysuria, urinary frequency as well as urgency and incomplete voiding.  She reports continued pressure of her lower abdomen.  Denies fevers nausea vomiting, abdominal pain or back pain.  HPI  Past Medical History:  Diagnosis Date  . COVID-19     There are no problems to display for this patient.   Past Surgical History:  Procedure Laterality Date  . TUBAL LIGATION      OB History   No obstetric history on file.      Home Medications    Prior to Admission medications   Medication Sig Start Date End Date Taking? Authorizing Provider  Multiple Vitamin (MULTIVITAMIN) tablet Take 1 tablet by mouth daily.   Yes [provider]  cephALEXin (KEFLEX) 500 MG capsule Take 1 capsule (500 mg total) by mouth 2 (two) times daily for 5 days. 03/27/20 04/01/20  Stephine Langbehn, Elesa Hacker, PA-C    Family History Family History  Problem Relation Age of Onset  . Healthy Mother     Social History Social History   Tobacco Use  . Smoking status: Never Smoker  . Smokeless tobacco: Never Used  Substance Use Topics  . Alcohol use: No  . Drug use: Never     Allergies   Patient has no known allergies.   Review of Systems Review of Systems  Constitutional: Negative for fever.  Respiratory: Negative for shortness of breath.   Cardiovascular: Negative for chest pain.  Gastrointestinal: Negative for abdominal pain, diarrhea, nausea and vomiting.  Genitourinary: Positive for dysuria, frequency and urgency. Negative for flank pain, genital sores, hematuria, menstrual problem, vaginal bleeding, vaginal discharge and vaginal pain.    Musculoskeletal: Negative for back pain.  Skin: Negative for rash.  Neurological: Negative for dizziness, light-headedness and headaches.     Physical Exam Triage Vital Signs ED Triage Vitals  Enc Vitals Group     BP 03/27/20 1938 121/72     Pulse Rate 03/27/20 1938 94     Resp 03/27/20 1938 18     Temp 03/27/20 1938 98.7 F (37.1 C)     Temp Source 03/27/20 1938 Oral     SpO2 03/27/20 1938 99 %     Weight 03/27/20 1940 265 lb (120.2 kg)     Height 03/27/20 1940 5\' 2"  (1.575 m)     Head Circumference --      Peak Flow --      Pain Score 03/27/20 1940 0     Pain Loc --      Pain Edu? --      Excl. in [place]? --    No data found.  Updated Vital Signs BP 121/72   Pulse 94   Temp 98.7 F (37.1 C) (Oral)   Resp 18   Ht 5\' 2"  (1.575 m)   Wt 265 lb (120.2 kg)   SpO2 99%   BMI 48.47 kg/m   Visual Acuity Right Eye Distance:   Left Eye Distance:   Bilateral Distance:    Right Eye Near:   Left Eye Near:    Bilateral Near:     Physical  Exam Vitals and nursing note reviewed.  Constitutional:      Appearance: She is well-developed.     Comments: No acute distress  HENT:     Head: Normocephalic and atraumatic.     Nose: Nose normal.  Eyes:     Conjunctiva/sclera: Conjunctivae normal.  Cardiovascular:     Rate and Rhythm: Normal rate.  Pulmonary:     Effort: Pulmonary effort is normal. No respiratory distress.  Abdominal:     General: There is no distension.  Musculoskeletal:        General: Normal range of motion.     Cervical back: Neck supple.  Skin:    General: Skin is warm and dry.  Neurological:     Mental Status: She is alert and oriented to person, place, and time.      UC Treatments / Results  Labs (all labs ordered are listed, but only abnormal results are displayed) Labs Reviewed  POCT URINALYSIS DIP (DEVICE) - Abnormal; Notable for the following components:      Result Value   Hgb urine dipstick TRACE (*)    pH 8.5 (*)    Nitrite POSITIVE  (*)    Leukocytes,Ua LARGE (*)    All other components within normal limits  URINE CULTURE  POC URINE PREG, ED    EKG   Radiology No results found.  Procedures Procedures (including critical care time)  Medications Ordered in UC Medications - No data to display  Initial Impression / Assessment and Plan / UC Course  I have reviewed the triage vital signs and the nursing notes.  Pertinent labs & imaging results that were available during my care of the patient were reviewed by me and considered in my medical decision making (see chart for details).     Large leuks, positive nitrites, empirically treating for UTI with Keflex x5 days.  Urine culture pending.  Will alter therapy as needed based on sensitivity results.  Push fluids.  Discussed strict return precautions. Patient verbalized understanding and is agreeable with plan.  Final Clinical Impressions(s) / UC Diagnoses   Final diagnoses:  Lower urinary tract infectious disease     Discharge Instructions     Urine showed evidence of infection. We are treating you with keflex twice daily for 5 days. Be sure to take full course. Stay hydrated- urine should be pale yellow to clear.  Please return or follow up with your primary provider if symptoms not improving with treatment. Please return sooner if you have worsening of symptoms or develop fever, nausea, vomiting, abdominal pain, back pain, lightheadedness, dizziness.    ED Prescriptions    Medication Sig Dispense Auth. Provider   cephALEXin (KEFLEX) 500 MG capsule Take 1 capsule (500 mg total) by mouth 2 (two) times daily for 5 days. 10 capsule Tatsuo Musial, [place] C, PA-C     PDMP not reviewed this encounter.   Viyan Rosamond, [place] C, PA-C 03/28/20 1006

## 2020-03-29 LAB — URINE CULTURE: Culture: >=100000 — AB

## 2020-04-06 ENCOUNTER — Other Ambulatory Visit (HOSPITAL_COMMUNITY)
Admission: RE | Admit: 2020-04-06 | Discharge: 2020-04-06 | Disposition: A | Discharge status: Home / Self Care | Payer: BC Managed Care – PPO | Source: Ambulatory Visit | Attending: Family Medicine | Admitting: Family Medicine

## 2020-04-06 DIAGNOSIS — Z01411 Encounter for gynecological examination (general) (routine) with abnormal findings: Secondary | ICD-10-CM | POA: Insufficient documentation

## 2020-04-07 LAB — CYTOLOGY - PAP
Comment: NEGATIVE
Diagnosis: NEGATIVE
High risk HPV: NEGATIVE

## 2020-04-21 ENCOUNTER — Ambulatory Visit
Admission: RE | Admit: 2020-04-21 | Discharge: 2020-04-21 | Disposition: A | Discharge status: Home / Self Care | Payer: BC Managed Care – PPO | Source: Ambulatory Visit | Attending: Family Medicine | Admitting: Family Medicine

## 2020-04-21 ENCOUNTER — Other Ambulatory Visit: Payer: Self-pay | Admitting: Family Medicine

## 2020-04-21 ENCOUNTER — Other Ambulatory Visit: Payer: Self-pay

## 2020-04-21 DIAGNOSIS — Z1231 Encounter for screening mammogram for malignant neoplasm of breast: Secondary | ICD-10-CM

## 2021-04-21 ENCOUNTER — Other Ambulatory Visit (HOSPITAL_COMMUNITY): Payer: Self-pay | Admitting: Family Medicine

## 2021-04-27 ENCOUNTER — Emergency Department (HOSPITAL_COMMUNITY): Payer: BC Managed Care – PPO

## 2021-04-27 ENCOUNTER — Ambulatory Visit (HOSPITAL_COMMUNITY)
Admission: EM | Admit: 2021-04-27 | Discharge: 2021-04-27 | Disposition: A | Discharge status: Home / Self Care | Payer: BC Managed Care – PPO | Attending: Internal Medicine | Admitting: Internal Medicine

## 2021-04-27 ENCOUNTER — Encounter (HOSPITAL_COMMUNITY): Payer: Self-pay

## 2021-04-27 ENCOUNTER — Emergency Department (HOSPITAL_COMMUNITY)
Admission: EM | Admit: 2021-04-27 | Discharge: 2021-04-27 | Disposition: A | Discharge status: Home / Self Care | Payer: BC Managed Care – PPO | Attending: Emergency Medicine | Admitting: Emergency Medicine

## 2021-04-27 ENCOUNTER — Other Ambulatory Visit: Payer: Self-pay

## 2021-04-27 DIAGNOSIS — R0789 Other chest pain: Secondary | ICD-10-CM | POA: Diagnosis not present

## 2021-04-27 DIAGNOSIS — R29810 Facial weakness: Secondary | ICD-10-CM | POA: Diagnosis not present

## 2021-04-27 DIAGNOSIS — R519 Headache, unspecified: Secondary | ICD-10-CM

## 2021-04-27 DIAGNOSIS — G510 Bell's palsy: Secondary | ICD-10-CM | POA: Diagnosis not present

## 2021-04-27 DIAGNOSIS — Z8616 Personal history of COVID-19: Secondary | ICD-10-CM | POA: Diagnosis not present

## 2021-04-27 LAB — CBC
HCT: 36.4 % (ref 36.0–46.0)
Hemoglobin: 11.3 g/dL — ABNORMAL LOW (ref 12.0–15.0)
MCH: 23.3 pg — ABNORMAL LOW (ref 26.0–34.0)
MCHC: 31 g/dL (ref 30.0–36.0)
MCV: 75.1 fL — ABNORMAL LOW (ref 80.0–100.0)
Platelets: 314 10*3/uL (ref 150–400)
RBC: 4.85 MIL/uL (ref 3.87–5.11)
RDW: 15.9 % — ABNORMAL HIGH (ref 11.5–15.5)
WBC: 12.3 10*3/uL — ABNORMAL HIGH (ref 4.0–10.5)
nRBC: 0 % (ref 0.0–0.2)

## 2021-04-27 LAB — BASIC METABOLIC PANEL
Anion gap: 7 (ref 5–15)
BUN: 7 mg/dL (ref 6–20)
CO2: 25 mmol/L (ref 22–32)
Calcium: 9 mg/dL (ref 8.9–10.3)
Chloride: 104 mmol/L (ref 98–111)
Creatinine, Ser: 0.68 mg/dL (ref 0.44–1.00)
GFR, Estimated: >60 mL/min (ref >60)
Glucose, Bld: 118 mg/dL — ABNORMAL HIGH (ref 70–99)
Potassium: 4.1 mmol/L (ref 3.5–5.1)
Sodium: 136 mmol/L (ref 135–145)

## 2021-04-27 LAB — TROPONIN I (HIGH SENSITIVITY): Troponin I (High Sensitivity): 4 ng/L (ref <18)

## 2021-04-27 MED ORDER — VALACYCLOVIR HCL 1 G PO TABS: 1000.0000 mg | ORAL_TABLET | Freq: Three times a day (TID) | ORAL | 0 refills | Status: DC

## 2021-04-27 MED ORDER — PREDNISONE 20 MG PO TABS: ORAL_TABLET | ORAL | 0 refills | Status: DC

## 2021-04-27 NOTE — ED Provider Notes (Signed)
[facility] EMERGENCY DEPARTMENT Provider Note   CSN: [id number] Arrival date & time: 04/27/21  1929     History Chief Complaint  Patient presents with   Chest Pain   Facial Droop    [name] is a 45 y.o. female.  45 yo F with a chief complaints of right-sided chest pain and left-sided facial droop.  This is been going on for about a week and a half.  Went to urgent care today and they were concerned about her symptoms and sent her here for evaluation.  The patient's chest pain is right-sided and sharp worse with movement of the arm.  Denies trauma to the area denies cough congestion or fever.  Left facial droop is been going on without pain for the past week and a half as well.  She denies any facial pain.  Denies rash.  Denies tick bite or exposure.    Patient denies history of MI, denies hypertension hyperlipidemia diabetes or smoking.  Denies family history of MI.  Patient denies history of PE or DVT denies hemoptysis denies unilateral lower extremity edema denies recent surgery immobilization hospitalization estrogen use or history of cancer.    The history is provided by the patient.  Chest Pain Pain location:  R chest and R lateral chest Pain quality: aching and sharp   Pain radiates to:  R shoulder Pain severity:  Moderate Onset quality:  Gradual Duration:  10 days Timing:  Intermittent Progression:  Waxing and waning Chronicity:  New Relieved by:  Nothing Worsened by:  Certain positions Ineffective treatments:  None tried Associated symptoms: no dizziness, no fever, no headache, no nausea, no palpitations, no shortness of breath and no vomiting       Past Medical History:  Diagnosis Date   COVID-19     There are no problems to display for this patient.   Past Surgical History:  Procedure Laterality Date   TUBAL LIGATION       OB History   No obstetric history on file.     Family History  Problem Relation Age of  Onset   Healthy Mother     Social History   Tobacco Use   Smoking status: Never   Smokeless tobacco: Never  Vaping Use   Vaping Use: Never used  Substance Use Topics   Alcohol use: No   Drug use: Never    Home Medications Prior to Admission medications   Medication Sig Start Date End Date Taking? Authorizing Provider  predniSONE (DELTASONE) 20 MG tablet 2 tabs po daily x 4 days 04/27/21  Yes Melene Plan, DO  valACYclovir (VALTREX) 1000 MG tablet Take 1 tablet (1,000 mg total) by mouth 3 (three) times daily. 04/27/21  Yes Melene Plan, DO  Multiple Vitamin (MULTIVITAMIN) tablet Take 1 tablet by mouth daily.    [provider]    Allergies    Patient has no known allergies.  Review of Systems   Review of Systems  Constitutional:  Negative for chills and fever.  HENT:  Negative for congestion and rhinorrhea.   Eyes:  Negative for redness and visual disturbance.  Respiratory:  Negative for shortness of breath and wheezing.   Cardiovascular:  Positive for chest pain. Negative for palpitations.  Gastrointestinal:  Negative for nausea and vomiting.  Genitourinary:  Negative for dysuria and urgency.  Musculoskeletal:  Negative for arthralgias and myalgias.  Skin:  Negative for pallor and wound.  Neurological:  Positive for facial asymmetry. Negative for dizziness  and headaches.   Physical Exam Updated Vital Signs BP 98/73   Pulse 80   Temp 98 F (36.7 C)   Resp 19   Ht 5\' 3"  (1.6 m)   Wt 113.9 kg   LMP 04/12/2021 (Exact Date)   SpO2 99%   BMI 44.46 kg/m   Physical Exam Vitals and nursing note reviewed.  Constitutional:      General: She is not in acute distress.    Appearance: She is well-developed. She is not diaphoretic.  HENT:     Head: Normocephalic and atraumatic.  Eyes:     Pupils: Pupils are equal, round, and reactive to light.  Cardiovascular:     Rate and Rhythm: Normal rate and regular rhythm.     Heart sounds: No murmur heard.   No friction  rub. No gallop.  Pulmonary:     Effort: Pulmonary effort is normal.     Breath sounds: No wheezing or rales.  Chest:     Chest wall: Tenderness (Pain with palpation of the right lateral chest wall worst about the lateral clavicular line about ribs 4 through 6 reproduces her symptoms pain reproduced with range of motion of the shoulder) present.  Abdominal:     General: There is no distension.     Palpations: Abdomen is soft.     Tenderness: There is no abdominal tenderness.  Musculoskeletal:        General: No tenderness.     Cervical back: Normal range of motion and neck supple.  Skin:    General: Skin is warm and dry.  Neurological:     Mental Status: She is alert and oriented to person, place, and time.     Cranial Nerves: Facial asymmetry present.     Motor: Motor function is intact.     Coordination: Coordination is intact.     Comments: Left-sided facial droop.  Not obviously involving the forehead.  Otherwise benign neurologic exam  Psychiatric:        Behavior: Behavior normal.    ED Results / Procedures / Treatments   Labs (all labs ordered are listed, but only abnormal results are displayed) Labs Reviewed  BASIC METABOLIC PANEL - Abnormal; Notable for the following components:      Result Value   Glucose, Bld 118 (*)    All other components within normal limits  CBC - Abnormal; Notable for the following components:   WBC 12.3 (*)    Hemoglobin 11.3 (*)    MCV 75.1 (*)    MCH 23.3 (*)    RDW 15.9 (*)    All other components within normal limits  TROPONIN I (HIGH SENSITIVITY)  TROPONIN I (HIGH SENSITIVITY)    EKG EKG Interpretation  Date/Time:  Tuesday April 27 2021 19:50:50 EDT Ventricular Rate:  93 PR Interval:  152 QRS Duration: 86 QT Interval:  337 QTC Calculation: 420 R Axis:   116 Text Interpretation: Right and left arm electrode reversal, interpretation assumes no reversal Sinus or ectopic atrial rhythm Right axis deviation Low voltage, precordial  leads Abnormal lateral Q waves Borderline repolarization abnormality No old tracing to compare Confirmed by 03[birth year]1992 [id number]) on 04/27/2021 8:21:11 PM  Radiology MR BRAIN WO CONTRAST  Result Date: 04/27/2021 CLINICAL DATA:  Encephalopathy EXAM: MRI HEAD WITHOUT CONTRAST TECHNIQUE: Multiplanar, multiecho pulse sequences of the brain and surrounding structures were obtained without intravenous contrast. COMPARISON:  None. FINDINGS: Brain: No acute infarct, mass effect or extra-axial collection. No acute or chronic hemorrhage. Hyperintense T2-weighted  signal is moderately widespread throughout the white matter. Generalized volume loss without a clear lobar predilection. The midline structures are normal. Vascular: Major flow voids are preserved. Skull and upper cervical spine: Normal calvarium and skull base. Visualized upper cervical spine and soft tissues are normal. Sinuses/Orbits:No paranasal sinus fluid levels or advanced mucosal thickening. No mastoid or middle ear effusion. Normal orbits. IMPRESSION: 1. No acute intracranial abnormality. 2. Generalized volume loss and findings of chronic microvascular ischemia. Electronically Signed   By: Deatra RobinsonKevin  Herman M.D.   On: 04/27/2021 22:37   DG Chest Port 1 View  Result Date: 04/27/2021 CLINICAL DATA:  Right side chest pain for 1 week. EXAM: PORTABLE CHEST 1 VIEW COMPARISON:  None. FINDINGS: The lungs are clear. Heart size is normal. No pneumothorax or pleural fluid. No acute or focal bony abnormality. IMPRESSION: Negative chest. Electronically Signed   By: Drusilla Kannerhomas  Dalessio M.D.   On: 04/27/2021 21:18    Procedures Procedures   Medications Ordered in ED Medications - No data to display  ED Course  I have reviewed the triage vital signs and the nursing notes.  Pertinent labs & imaging results that were available during my care of the patient were reviewed by me and considered in my medical decision making (see chart for details).    MDM  Rules/Calculators/A&P                          45 yo F with a chief complaints of left-sided facial droop and right-sided chest pain.  Is been going on for about a week and a half.  She went to urgent care and was sent here for evaluation.  On exam the patient's chest pain is most likely musculoskeletal and reproduced on exam.  Completely unrelated most likely to her facial nerve palsy.  I suspect that that is most likely Bell's palsy though I do not appreciate any forehead involvement.  We will obtain an MRI.  MRI shows chronic ischemic disease though no acute infarct.  I discussed these results with the patient.  We will have her follow-up with neurologist in the office.  Offered treatment for Bell's palsy though cautioned that it may not help started this late in the course.  Chest x-ray viewed by me without focal infiltrate troponin negative.  Most likely the chest pain is musculoskeletal.  We will have her take Tylenol and ibuprofen at home for pain.  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  11:22 PM:  I have discussed the diagnosis/risks/treatment options with the patient and believe the pt to be eligible for discharge home to follow-up with Neuro, PCP. We also discussed returning to the ED immediately if new or worsening sx occur. We discussed the sx which are most concerning (e.g., sudden worsening pain, fever, inability to tolerate by mouth) that necessitate immediate return. Medications administered to the patient during their visit and any new prescriptions provided to the patient are listed below.  Medications given during this visit Medications - No data to display   The patient appears reasonably screen and/or stabilized for discharge and I doubt any other medical condition or other [facility] requiring further screening, evaluation, or treatment in the ED at this time prior to discharge.   Final Clinical Impression(s) / ED  Diagnoses Final diagnoses:  Bell's palsy  Atypical chest pain    Rx / DC Orders ED Discharge Orders  Ordered    predniSONE (DELTASONE) 20 MG tablet        04/27/21 2247    valACYclovir (VALTREX) 1000 MG tablet  3 times daily        04/27/21 2247    Ambulatory referral to Neurology       Comments: bells   04/27/21 2251             Melene Plan, DO 04/27/21 2322

## 2021-04-27 NOTE — ED Triage Notes (Signed)
Pt BIB EMS from UC. Pt presents with 1 week hx of R sided CP, HA, L facial droop x 1 week.

## 2021-04-27 NOTE — Discharge Instructions (Addendum)
Patient was sent to the hospital via EMS.  

## 2021-04-27 NOTE — ED Triage Notes (Signed)
Pt presents with chest tightness that comes and goes. Pt states it started 1 week. C/o right arm pain that started last night. C/o headache that has been on and off for 1 week.  Pt states she had an EKG done after experiencing these sxs with her PCP.   States she has pulling pain on hr right leg.

## 2021-04-27 NOTE — ED Notes (Addendum)
Pt provided discharge instructions and prescription information. Pt was given the opportunity to ask questions and questions were answered. Discharge signature not obtained in the setting of the COVID-19 pandemic in order to reduce high touch surfaces.  ° °

## 2021-04-27 NOTE — ED Provider Notes (Signed)
[facility]    CSN: [id number] Arrival date & time: 04/27/21  1818      History   Chief Complaint Chief Complaint  Patient presents with   Chest Pain   Headache   Arm Pain    HPI [name] is a 45 y.o. female.   Patient presents with 1.5-week history of chest pain, right arm numbness/pain and left facial drooping. Denies any shortness of breath.  Also complaining of headache that has been intermittent for the same time period.  Chest pain is described as intermittent "tightness".  Patient states that she went to her PCP when symptoms started and EKG was normal.  Denies any cardiac history or any medical history other than prediabetic.  Patient states that she does not take any prescription medications.   Chest Pain Headache Arm Pain   Past Medical History:  Diagnosis Date   COVID-19     There are no problems to display for this patient.   Past Surgical History:  Procedure Laterality Date   TUBAL LIGATION      OB History   No obstetric history on file.      Home Medications    Prior to Admission medications   Medication Sig Start Date End Date Taking? Authorizing Provider  Multiple Vitamin (MULTIVITAMIN) tablet Take 1 tablet by mouth daily.    [provider]    Family History Family History  Problem Relation Age of Onset   Healthy Mother     Social History Social History   Tobacco Use   Smoking status: Never   Smokeless tobacco: Never  Substance Use Topics   Alcohol use: No   Drug use: Never     Allergies   Patient has no known allergies.   Review of Systems Review of Systems Per HPI  Physical Exam Triage Vital Signs ED Triage Vitals  Enc Vitals Group     BP 04/27/21 1827 (!) 167/94     Pulse Rate 04/27/21 1827 97     Resp 04/27/21 1827 19     Temp 04/27/21 1827 98.4 F (36.9 C)     Temp Source 04/27/21 1827 Oral     SpO2 04/27/21 1827 98 %     Weight --      Height --      Head Circumference --       Peak Flow --      Pain Score 04/27/21 1911 0     Pain Loc --      Pain Edu? --      Excl. in GC? --    No data found.  Updated Vital Signs BP (!) 167/94 (BP Location: Right Arm)   Pulse 97   Temp 98.4 F (36.9 C) (Oral)   Resp 19   LMP 04/12/2021 (Exact Date)   SpO2 98%   Visual Acuity Right Eye Distance:   Left Eye Distance:   Bilateral Distance:    Right Eye Near:   Left Eye Near:    Bilateral Near:     Physical Exam Constitutional:      General: She is not in acute distress.    Appearance: Normal appearance.  HENT:     Head: Normocephalic and atraumatic.  Eyes:     Extraocular Movements: Extraocular movements intact.     Conjunctiva/sclera: Conjunctivae normal.  Cardiovascular:     Rate and Rhythm: Normal rate and regular rhythm.     Pulses: Normal pulses.     Heart sounds: Normal  heart sounds.  Pulmonary:     Effort: Pulmonary effort is normal.     Breath sounds: Normal breath sounds.  Abdominal:     General: Abdomen is flat. Bowel sounds are normal. There is no distension.     Palpations: Abdomen is soft.  Musculoskeletal:     Right upper arm: No swelling or tenderness.     Left upper arm: Normal.     Comments: No tenderness to palpation to right arm.  Skin:    General: Skin is warm and dry.  Neurological:     General: No focal deficit present.     Mental Status: She is alert and oriented to person, place, and time. Mental status is at baseline.     Cranial Nerves: Facial asymmetry present.     Sensory: Sensation is intact.     Motor: Motor function is intact.     Coordination: Coordination is intact.     Gait: Gait is intact.     Comments: Patient has mild left-sided facial drooping with smiling.  Psychiatric:        Mood and Affect: Mood normal.        Behavior: Behavior normal.        Thought Content: Thought content normal.        Judgment: Judgment normal.     UC Treatments / Results  Labs (all labs ordered are listed, but only  abnormal results are displayed) Labs Reviewed - No data to display  EKG   Radiology No results found.  Procedures Procedures (including critical care time)  Medications Ordered in UC Medications - No data to display  Initial Impression / Assessment and Plan / UC Course  I have reviewed the triage vital signs and the nursing notes.  Pertinent labs & imaging results that were available during my care of the patient were reviewed by me and considered in my medical decision making (see chart for details).     EKG showing normal sinus rhythm but patient history and physical exam are worrisome. Due to clinical signs and symptoms and physical exam,  advised patient that she should go to the hospital for further evaluation and management.  Code stroke not warranted at this time due to duration of symptoms. Patient was agreeable and left urgent care via EMS to be transported to the hospital emergency department. Final Clinical Impressions(s) / UC Diagnoses   Final diagnoses:  Other chest pain  Acute nonintractable headache, unspecified headache type  Facial droop     Discharge Instructions      Patient was sent to the hospital via EMS.     ED Prescriptions   None    PDMP not reviewed this encounter.   Lance Muss, FNP 04/27/21 1921

## 2021-04-27 NOTE — ED Notes (Signed)
Patient transported to MRI 

## 2021-04-27 NOTE — ED Notes (Signed)
Patient is being discharged from the Urgent Care and sent to the Emergency Department via EMS . Per Henrene Dodge NP, patient is in need of higher level of care due to chest pain, abnormal EKG. Patient is aware and verbalizes understanding of plan of care.  Vitals:   04/27/21 1827  BP: (!) 167/94  Pulse: 97  Resp: 19  Temp: 98.4 F (36.9 C)  SpO2: 98%

## 2021-04-30 ENCOUNTER — Encounter: Payer: Self-pay | Admitting: Neurology

## 2021-05-06 ENCOUNTER — Ambulatory Visit (HOSPITAL_BASED_OUTPATIENT_CLINIC_OR_DEPARTMENT_OTHER): Admission: RE | Admit: 2021-05-06 | Payer: BC Managed Care – PPO | Source: Ambulatory Visit

## 2021-05-17 ENCOUNTER — Ambulatory Visit (HOSPITAL_BASED_OUTPATIENT_CLINIC_OR_DEPARTMENT_OTHER): Payer: BC Managed Care – PPO

## 2021-06-22 ENCOUNTER — Other Ambulatory Visit: Payer: Self-pay

## 2021-06-22 ENCOUNTER — Ambulatory Visit (HOSPITAL_BASED_OUTPATIENT_CLINIC_OR_DEPARTMENT_OTHER)
Admission: RE | Admit: 2021-06-22 | Discharge: 2021-06-22 | Disposition: A | Discharge status: Home / Self Care | Payer: BC Managed Care – PPO | Source: Ambulatory Visit | Attending: Family Medicine | Admitting: Family Medicine

## 2021-06-22 DIAGNOSIS — E7800 Pure hypercholesterolemia, unspecified: Secondary | ICD-10-CM | POA: Insufficient documentation

## 2021-07-14 NOTE — Progress Notes (Signed)
NEUROLOGY CONSULTATION NOTE  PEBBLES ZEIDERS MRN: [id number] DOB: 01/06/1976  Referring provider: Melene Plan, DO (ED referral) Primary care provider: Garth Bigness, MD  Reason for consult:  left-sided Bell's palsy  Assessment/Plan:   Likely left-sided Bell's palsy following COVID infection - even though weakness spared the forehead, MRI of brain was negative and she is young with no stroke risk factors.  Very mild white matter changes nonspecific and not concerning or secondary etiology such as MS.  Overall, symptoms are very mild.  No further workup or follow up warranted.   Subjective:  [name] is a 45 year old right-handed female who presents for left-sided Bell's palsy.  History supplemented by ED note.  She had COVID in June with fever, nausea, cough and chest congestion.  In early July, she noticed left sided facial droop and chest pain.  No unilateral extremity numbness or weakness.  About 7 to 10 days later, on 7/12, she went to Urgent Care who sent her to the ED.  She was noted to have lower facial weakness sparing the forehead.  Facial sensation felt slightly different on left to touch.  No pain, change in hearing, change in taste.  MRI of brain showed without contrast personally reviewed showed mild punctate T2/FLAIR foci in the bilateral cerebral white matter and mild generalized volume loss but no acute findings..  EKG and troponin negative for acute coronary event.  Chest pain determined to be musculoskeletal.  She was diagnosed with Bell's palsy and discharged on valacyclovir and prednisone.  She hasn't noticed any improvement in the facial weakness.     PAST MEDICAL HISTORY: Past Medical History:  Diagnosis Date   COVID-19     PAST SURGICAL HISTORY: Past Surgical History:  Procedure Laterality Date   TUBAL LIGATION      MEDICATIONS: Current Outpatient Medications on File Prior to Visit  Medication Sig Dispense Refill   Multiple Vitamin  (MULTIVITAMIN) tablet Take 1 tablet by mouth daily.     predniSONE (DELTASONE) 20 MG tablet 2 tabs po daily x 4 days 8 tablet 0   valACYclovir (VALTREX) 1000 MG tablet Take 1 tablet (1,000 mg total) by mouth 3 (three) times daily. 21 tablet 0   No current facility-administered medications on file prior to visit.    ALLERGIES: No Known Allergies  FAMILY HISTORY: Family History  Problem Relation Age of Onset   Healthy Mother     Objective:  Blood pressure 126/77, pulse 81, height 5\' 3"  (1.6 m), weight 257 lb 12.8 oz (116.9 kg), SpO2 98 %. General: No acute distress.  Patient appears well-groomed.   Head:  Normocephalic/atraumatic Eyes:  fundi examined but not visualized Neck: supple, no paraspinal tenderness, full range of motion Back: No paraspinal tenderness Heart: regular rate and rhythm Lungs: Clear to auscultation bilaterally. Vascular: No carotid bruits. Neurological Exam: Mental status: alert and oriented to person, place, and time, recent and remote memory intact, fund of knowledge intact, attention and concentration intact, speech fluent and not dysarthric, language intact. Cranial nerves: CN I: not tested CN II: pupils equal, round and reactive to light, visual fields intact CN III, IV, VI:  full range of motion, no nystagmus, no ptosis CN V: facial sensation intact. CN VII: very slight left lower facial droop. CN VIII: hearing slightly reduced on left. CN IX, X: gag intact, uvula midline CN XI: sternocleidomastoid and trapezius muscles intact CN XII: tongue midline Bulk & Tone: normal, no fasciculations. Motor:  muscle strength 5/5 throughout  Sensation:  Pinprick, temperature and vibratory sensation intact. Deep Tendon Reflexes:  2+ throughout,  toes downgoing.   Finger to nose testing:  Without dysmetria.   Heel to shin:  Without dysmetria.   Gait:  Normal station and stride.  Romberg negative.    Thank you for allowing me to take part in the care of this  patient.  Shon Millet, DO  CC: Garth Bigness, MD

## 2021-07-16 ENCOUNTER — Ambulatory Visit: Payer: BC Managed Care – PPO | Admitting: Neurology

## 2021-07-16 ENCOUNTER — Encounter: Payer: Self-pay | Admitting: Neurology

## 2021-07-16 ENCOUNTER — Other Ambulatory Visit: Payer: Self-pay

## 2021-07-16 VITALS — BP 126/77 | HR 81 | Ht 63.0 in | Wt 257.8 lb

## 2021-07-16 DIAGNOSIS — G510 Bell's palsy: Secondary | ICD-10-CM

## 2021-07-16 NOTE — Patient Instructions (Signed)
Likely Bell's palsy.  Mri of brain was unremarkable.

## 2022-01-26 IMAGING — MG DIGITAL SCREENING BILAT W/ TOMO W/ CAD
8 series · 8 of 24 positions shown · non-contrast
Comparison: None.

CLINICAL DATA: Screening. Baseline.

EXAM:
DIGITAL SCREENING BILATERAL MAMMOGRAM WITH TOMO AND CAD

[R CC synth-2D]
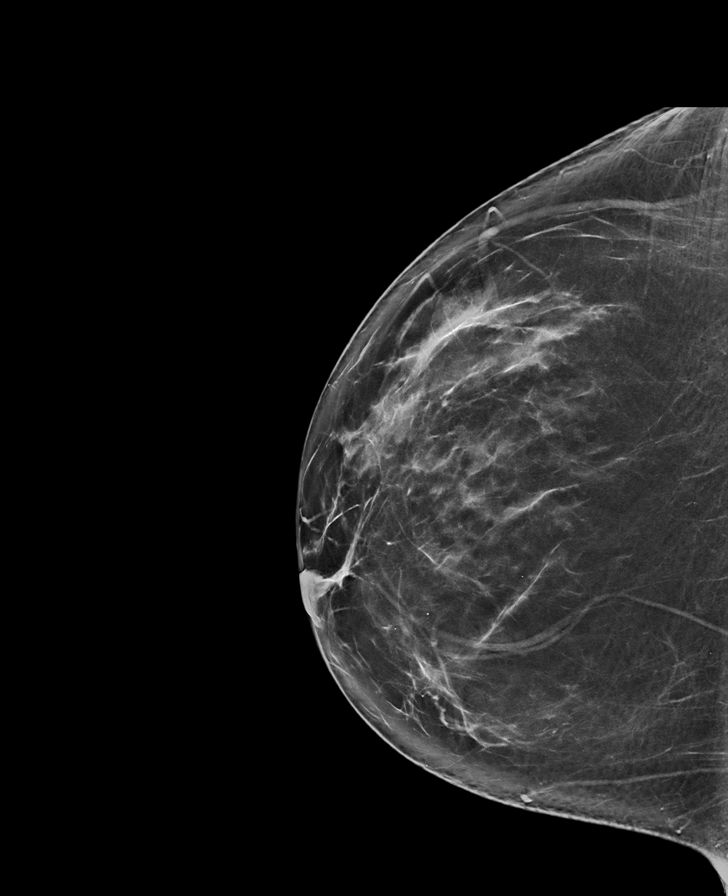

[R MLO synth-2D]
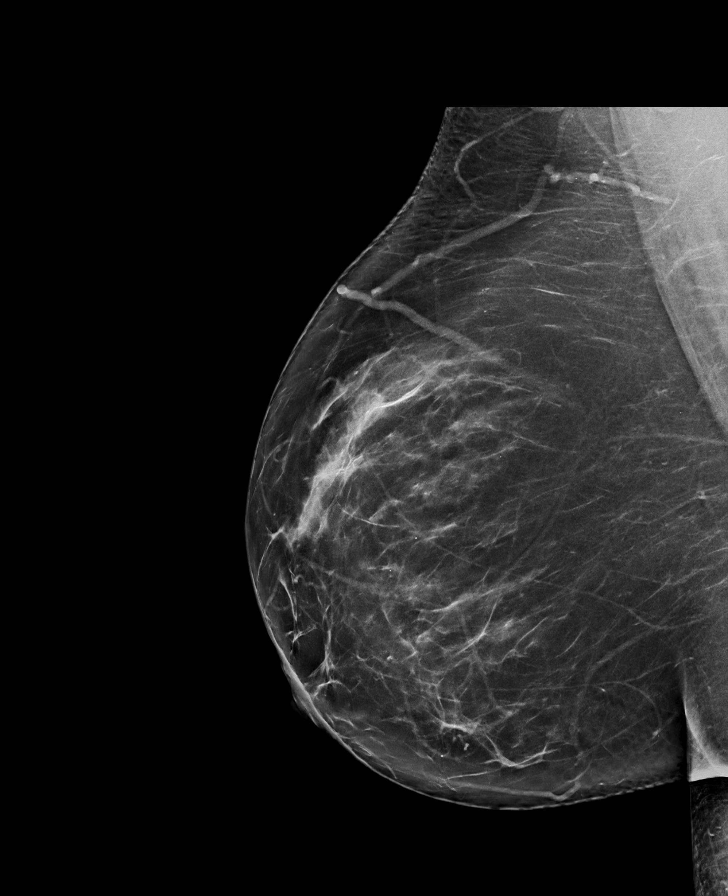

[L CC synth-2D]
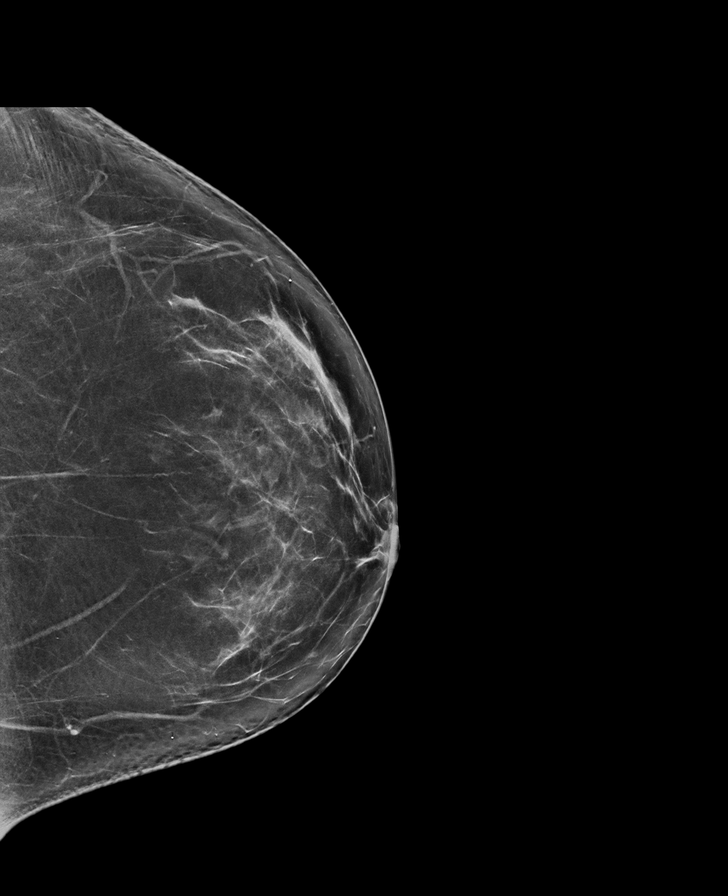

[L MLO synth-2D]
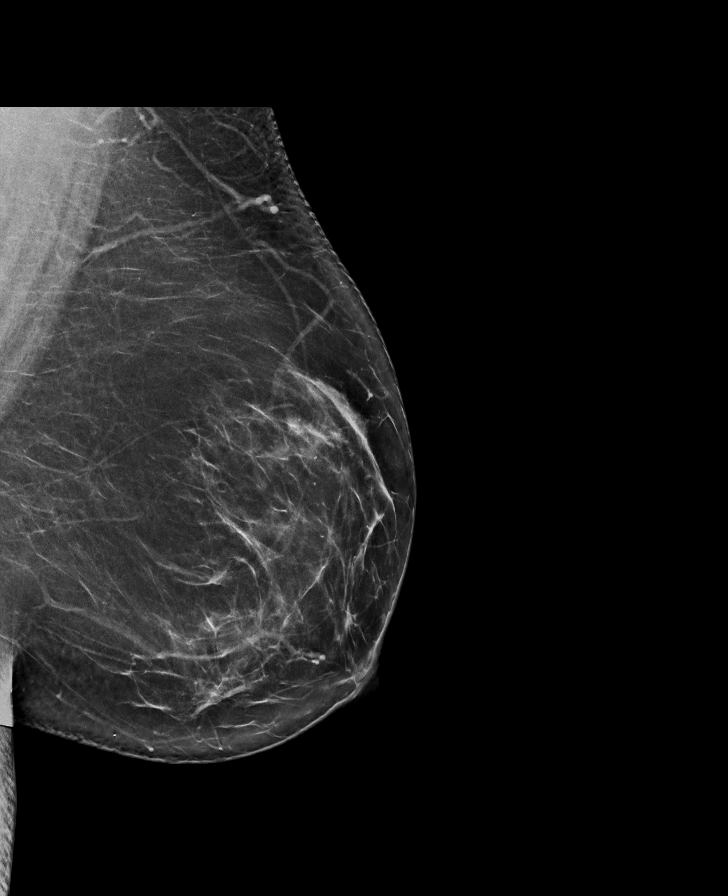

[L MLO tomo · tomo slice 42/83.0]
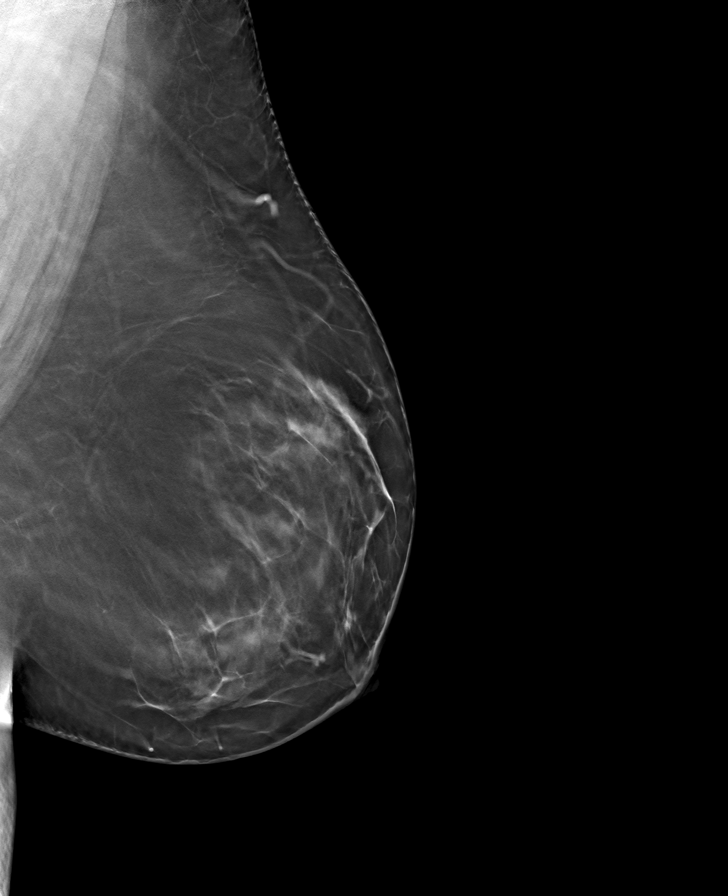

[L CC tomo · tomo slice 40/79.0]
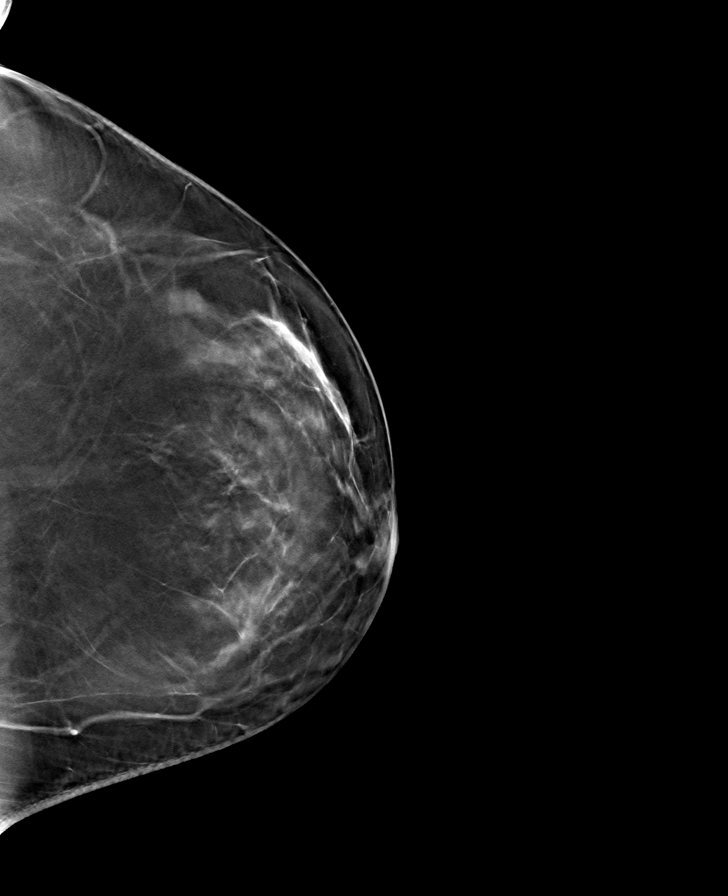

[R MLO tomo · tomo slice 45/88.0]
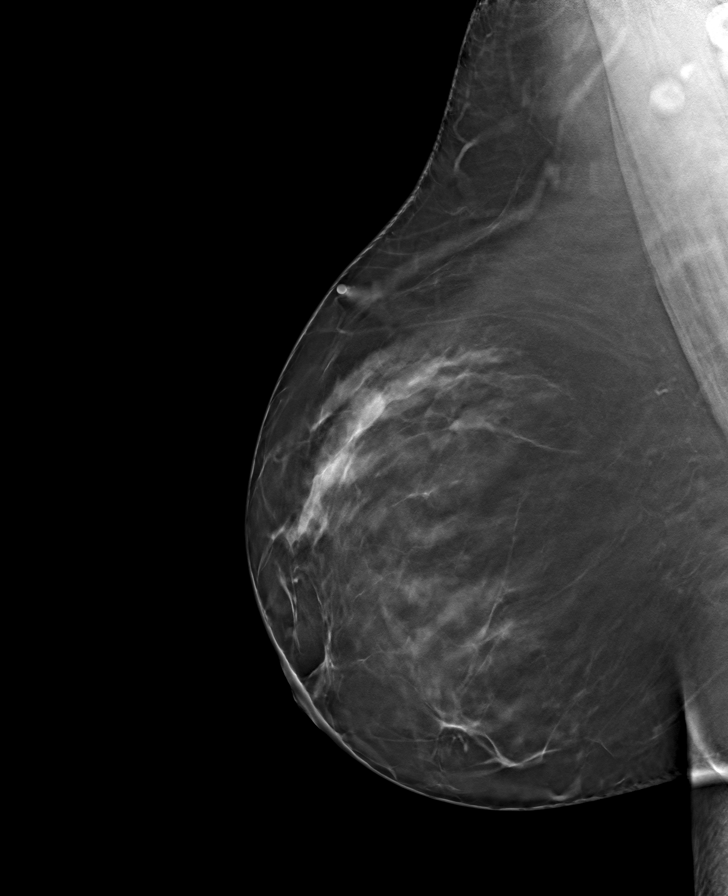

[R CC tomo · tomo slice 41/80.0]
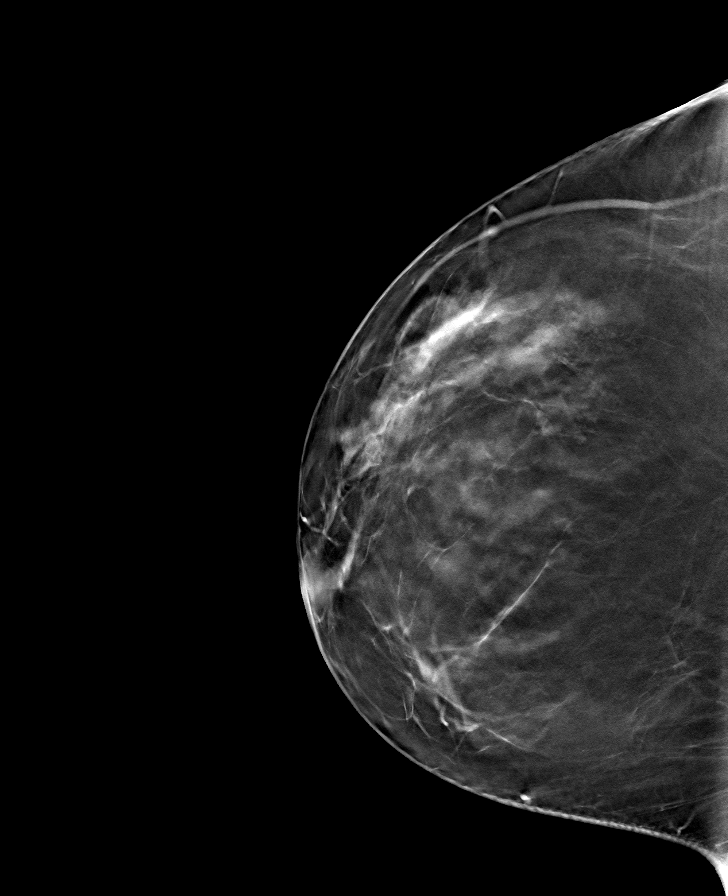

[8 of 24 positions shown; findings below may reference images not displayed]

ACR Breast Density Category b: There are scattered areas of
fibroglandular density.
FINDINGS: There are no findings suspicious for malignancy. Images were
processed with CAD.
IMPRESSION: No mammographic evidence of malignancy. A result letter of this
screening mammogram will be mailed directly to the patient.

RECOMMENDATION:
Screening mammogram in one year. (Code:H9-X-36S)

BI-RADS CATEGORY  1: Negative.

## 2024-08-12 ENCOUNTER — Other Ambulatory Visit: Payer: Self-pay | Admitting: Family Medicine

## 2024-08-12 DIAGNOSIS — Z1231 Encounter for screening mammogram for malignant neoplasm of breast: Secondary | ICD-10-CM

## 2024-08-29 ENCOUNTER — Ambulatory Visit
Admission: RE | Admit: 2024-08-29 | Discharge: 2024-08-29 | Disposition: A | Discharge status: Home / Self Care | Source: Ambulatory Visit | Attending: Family Medicine | Admitting: Family Medicine

## 2024-08-29 DIAGNOSIS — Z1231 Encounter for screening mammogram for malignant neoplasm of breast: Secondary | ICD-10-CM
